# Patient Record
Sex: Male | Born: 2000
Health system: Southern US, Community
[De-identification: ages and names within clinical notes are randomized; demographics above are authoritative.]

## PROBLEM LIST (undated history)

## (undated) HISTORY — PX: OTHER SURGICAL HISTORY: SHX169

---
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
## 2020-02-10 ENCOUNTER — Other Ambulatory Visit: Payer: Self-pay

## 2020-02-10 ENCOUNTER — Ambulatory Visit (HOSPITAL_COMMUNITY): Payer: Self-pay

## 2020-02-10 ENCOUNTER — Ambulatory Visit (HOSPITAL_COMMUNITY)
Admission: EM | Admit: 2020-02-10 | Discharge: 2020-02-10 | Disposition: A | Discharge status: Home / Self Care | Payer: Medicaid Other

## 2020-02-10 ENCOUNTER — Encounter (HOSPITAL_COMMUNITY): Payer: Self-pay | Admitting: Emergency Medicine

## 2020-02-10 DIAGNOSIS — S51812A Laceration without foreign body of left forearm, initial encounter: Secondary | ICD-10-CM | POA: Diagnosis not present

## 2020-02-10 DIAGNOSIS — M79632 Pain in left forearm: Secondary | ICD-10-CM | POA: Diagnosis not present

## 2020-02-10 DIAGNOSIS — Z23 Encounter for immunization: Secondary | ICD-10-CM | POA: Diagnosis not present

## 2020-02-10 MED ORDER — TETANUS-DIPHTH-ACELL PERTUSSIS 5-2.5-18.5 LF-MCG/0.5 IM SUSP
INTRAMUSCULAR | Status: AC
  Filled 2020-02-10: qty 0.5

## 2020-02-10 MED ORDER — TETANUS-DIPHTH-ACELL PERTUSSIS 5-2.5-18.5 LF-MCG/0.5 IM SUSP
0.5000 mL | Freq: Once | INTRAMUSCULAR | Status: AC
  Administered 2020-02-10: 0.5 mL via INTRAMUSCULAR

## 2020-02-10 NOTE — ED Provider Notes (Signed)
  [facility]   MRN: [id number] DOB: Mar 31, 2001  Subjective:   Michael Walters is a 19 y.o. male presenting for suffering a left forearm laceration from a rusty fence.  He has kept the wound covered and clean.  Needs to have his Tdap updated.   Current Facility-Administered Medications:  .  Tdap (BOOSTRIX) injection 0.5 mL, 0.5 mL, Intramuscular, Once, Wallis Bamberg, PA-C  Current Outpatient Medications:  .  cloNIDine (CATAPRES) 0.3 MG tablet, Take by mouth., Disp: , Rfl:  .  estradiol (ESTRACE) 2 MG tablet, Take by mouth., Disp: , Rfl:  .  Methylphenidate HCl (QUILLICHEW ER) 30 MG CHER chewable tablet, Take by mouth., Disp: , Rfl:  .  spironolactone (ALDACTONE) 50 MG tablet, Take by mouth., Disp: , Rfl:    No Known Allergies  History reviewed. No pertinent past medical history.   Past Surgical History:  Procedure Laterality Date  . arm surgery      Family History  Problem Relation Age of Onset  . COPD Father     Social History   Tobacco Use  . Smoking status: Never Smoker  Substance Use Topics  . Alcohol use: Never  . Drug use: Not Currently    Types: Marijuana    ROS   Objective:   Vitals: BP (!) 122/91 (BP Location: Left Arm)   Pulse 91   Temp 98.3 F (36.8 C) (Oral)   Resp 16   SpO2 100%   Physical Exam Constitutional:      General: He is not in acute distress.    Appearance: Normal appearance. He is well-developed and normal weight. He is not ill-appearing, toxic-appearing or diaphoretic.  HENT:     Head: Normocephalic and atraumatic.     Right Ear: External ear normal.     Left Ear: External ear normal.     Nose: Nose normal.     Mouth/Throat:     Pharynx: Oropharynx is clear.  Eyes:     General: No scleral icterus.       Right eye: No discharge.        Left eye: No discharge.     Extraocular Movements: Extraocular movements intact.     Pupils: Pupils are equal, round, and reactive to light.  Cardiovascular:     Rate and  Rhythm: Normal rate.  Pulmonary:     Effort: Pulmonary effort is normal.  Musculoskeletal:     Left forearm: Laceration present. No swelling, edema, deformity, tenderness or bony tenderness.       Arms:     Cervical back: Normal range of motion.  Neurological:     Mental Status: He is alert and oriented to person, place, and time.  Psychiatric:        Mood and Affect: Mood normal.        Behavior: Behavior normal.        Thought Content: Thought content normal.        Judgment: Judgment normal.      Assessment and Plan :   PDMP not reviewed this encounter.  1. Pain of left forearm   2. Laceration of left forearm, initial encounter   3. Need for diphtheria-tetanus-pertussis (Tdap) vaccine     Dressing applied.  Wound care reviewed.  Tdap updated. Counseled patient on potential for adverse effects with medications prescribed/recommended today, ER and return-to-clinic precautions discussed, patient verbalized understanding.    Wallis Bamberg, [place] 02/10/20 1831

## 2020-02-10 NOTE — ED Triage Notes (Signed)
Cut left forearm on a rusty fence on Monday.  Unsure when last tdap was received.

## 2020-02-18 ENCOUNTER — Encounter (HOSPITAL_COMMUNITY): Payer: Self-pay | Admitting: Emergency Medicine

## 2020-02-18 ENCOUNTER — Other Ambulatory Visit: Payer: Self-pay

## 2020-02-18 ENCOUNTER — Emergency Department (HOSPITAL_COMMUNITY)
Admission: EM | Admit: 2020-02-18 | Discharge: 2020-02-19 | Disposition: A | Discharge status: Home / Self Care | Payer: Medicaid Other | Attending: Emergency Medicine | Admitting: Emergency Medicine

## 2020-02-18 ENCOUNTER — Emergency Department (HOSPITAL_COMMUNITY): Payer: Medicaid Other

## 2020-02-18 DIAGNOSIS — Z5321 Procedure and treatment not carried out due to patient leaving prior to being seen by health care provider: Secondary | ICD-10-CM | POA: Insufficient documentation

## 2020-02-18 DIAGNOSIS — R0602 Shortness of breath: Secondary | ICD-10-CM | POA: Diagnosis present

## 2020-02-18 NOTE — ED Triage Notes (Signed)
Pt c/o shortness of breath on exertion x 1 week. Pt reports having a tetanus booster 1 week ago as well.

## 2020-02-19 NOTE — ED Notes (Signed)
Pt called for VS recheck, no response  

## 2020-08-30 ENCOUNTER — Encounter (HOSPITAL_COMMUNITY): Payer: Self-pay | Admitting: Emergency Medicine

## 2020-08-31 ENCOUNTER — Ambulatory Visit (HOSPITAL_COMMUNITY)
Admission: RE | Admit: 2020-08-31 | Discharge: 2020-08-31 | Disposition: A | Discharge status: Home / Self Care | Payer: Medicaid Other | Source: Ambulatory Visit | Attending: Emergency Medicine | Admitting: Emergency Medicine

## 2020-08-31 ENCOUNTER — Other Ambulatory Visit: Payer: Self-pay

## 2020-08-31 ENCOUNTER — Encounter (HOSPITAL_COMMUNITY): Payer: Self-pay

## 2020-08-31 VITALS — BP 123/80 | HR 93 | Temp 97.6°F | Resp 17

## 2020-08-31 DIAGNOSIS — J029 Acute pharyngitis, unspecified: Secondary | ICD-10-CM

## 2020-08-31 LAB — POCT RAPID STREP A, ED / UC: Streptococcus, Group A Screen (Direct): NEGATIVE

## 2020-08-31 NOTE — Discharge Instructions (Signed)
Warm salt water gargles for comfort  Can use otc lozenges and/or chloraseptic spray for comfort  Can use hot or cold liquids for additional comfort  Follow up at Urgent Care for worsening or persistent sore throat

## 2020-08-31 NOTE — ED Provider Notes (Signed)
[facility]    CSN: [id number] Arrival date & time: 08/31/20  1358      History   Chief Complaint Chief Complaint  Patient presents with  . Appointment  . Sore Throat    HPI Michael Walters is a 20 y.o. male.   Patient presents with lump in throat for four day making it painful to swallow. Lump causing pressure in left ear and check. Described as dry and scratchy two days ago.Tolerating food and liquid. Denies fever, chills, congestion, runny nose, cough, shortness of breath, sinus pressure, visual changes, changes in hearing. No known sick contacts.       History reviewed. No pertinent past medical history.  There are no problems to display for this patient.   Past Surgical History:  Procedure Laterality Date  . arm surgery         Home Medications    Prior to Admission medications   Medication Sig Start Date End Date Taking? Authorizing Provider  cloNIDine (CATAPRES) 0.3 MG tablet Take by mouth. 09/16/19 09/15/20 Yes [provider]  estradiol (CLIMARA - DOSED IN MG/24 HR) 0.1 mg/24hr patch Place onto the skin. 08/31/20 08/31/21 Yes [provider]  spironolactone (ALDACTONE) 50 MG tablet Take by mouth. 05/18/20 11/22/20 Yes [provider]  estradiol (ESTRACE) 2 MG tablet Take by mouth. 01/26/20 04/25/20  [provider]  Methylphenidate HCl (QUILLICHEW ER) 30 MG CHER chewable tablet Take by mouth. 03/05/19   [provider]  spironolactone (ALDACTONE) 50 MG tablet Take by mouth. 01/26/20 05/02/20  [provider]    Family History Family History  Problem Relation Age of Onset  . COPD Father     Social History Social History   Tobacco Use  . Smoking status: Never Smoker  . Smokeless tobacco: Never Used  Substance Use Topics  . Alcohol use: Never  . Drug use: Not Currently    Types: Marijuana     Allergies   Patient has no known allergies.   Review of Systems Review of Systems   Constitutional: Negative.   HENT: Positive for ear pain and sore throat. Negative for congestion, dental problem, drooling, ear discharge, facial swelling, hearing loss, mouth sores, nosebleeds, postnasal drip, rhinorrhea, sinus pressure, sinus pain, sneezing, tinnitus, trouble swallowing and voice change.   Eyes: Negative.   Respiratory: Negative.   Cardiovascular: Negative.   Gastrointestinal: Negative.   Skin: Negative.      Physical Exam Triage Vital Signs ED Triage Vitals  Enc Vitals Group     BP 08/31/20 1413 123/80     Pulse Rate 08/31/20 1413 93     Resp 08/31/20 1413 17     Temp 08/31/20 1413 97.6 F (36.4 C)     Temp Source 08/31/20 1413 Oral     SpO2 08/31/20 1413 98 %     Weight --      Height --      Head Circumference --      Peak Flow --      Pain Score 08/31/20 1411 3     Pain Loc --      Pain Edu? --      Excl. in GC? --    No data found.  Updated Vital Signs BP 123/80 (BP Location: Right Arm)   Pulse 93   Temp 97.6 F (36.4 C) (Oral)   Resp 17   SpO2 98%   Visual Acuity Right Eye Distance:   Left Eye Distance:   Bilateral Distance:  Right Eye Near:   Left Eye Near:    Bilateral Near:     Physical Exam Constitutional:      Appearance: He is well-developed and normal weight.  HENT:     Head: Normocephalic.     Right Ear: Tympanic membrane and ear canal normal.     Left Ear: Tympanic membrane and ear canal normal.     Nose: No congestion or rhinorrhea.     Mouth/Throat:     Mouth: Mucous membranes are moist.     Pharynx: Uvula midline. Posterior oropharyngeal erythema present.     Tonsils: No tonsillar exudate or tonsillar abscesses.  Eyes:     Conjunctiva/sclera: Conjunctivae normal.     Pupils: Pupils are equal, round, and reactive to light.  Neck:     Thyroid: No thyromegaly.  Pulmonary:     Effort: Pulmonary effort is normal.  Musculoskeletal:     Cervical back: Normal range of motion and neck supple.  Lymphadenopathy:      Cervical: No cervical adenopathy.  Skin:    General: Skin is warm and dry.  Neurological:     Mental Status: He is alert and oriented to person, place, and time.  Psychiatric:        Mood and Affect: Mood normal.        Behavior: Behavior normal.      UC Treatments / Results  Labs (all labs ordered are listed, but only abnormal results are displayed) Labs Reviewed - No data to display  EKG   Radiology No results found.  Procedures Procedures (including critical care time)  Medications Ordered in UC Medications - No data to display  Initial Impression / Assessment and Plan / UC Course  I have reviewed the triage vital signs and the nursing notes.  Pertinent labs & imaging results that were available during my care of the patient were reviewed by me and considered in my medical decision making (see chart for details).  Viral pharyngitis   1. Rapid strep- negative 2. Warm salt water gargles as needed 3. Use of hot or cold liquid for comfort 4. otc lozenges, otc chloraseptic spray as needed for comfort Final Clinical Impressions(s) / UC Diagnoses   Final diagnoses:  None   Discharge Instructions   None    ED Prescriptions    None     PDMP not reviewed this encounter.   Valinda Hoar, NP 08/31/20 1526

## 2020-08-31 NOTE — ED Triage Notes (Signed)
Pt states he has had a lump in his throat x 4 days. He states it is difficult to swallow and states he feels a sharp pain in his throat. He states it is uncomfortable and states when he swallows he feels pain on left ear and left side of face.

## 2020-09-03 LAB — CULTURE, GROUP A STREP (THRC)

## 2020-12-27 IMAGING — DX DG CHEST 2V
2 series · 2 of 2 positions shown · non-contrast
Comparison: None

CLINICAL DATA: 19-year-old male with shortness of breath.

EXAM:
CHEST - 2 VIEW

[chest pa]
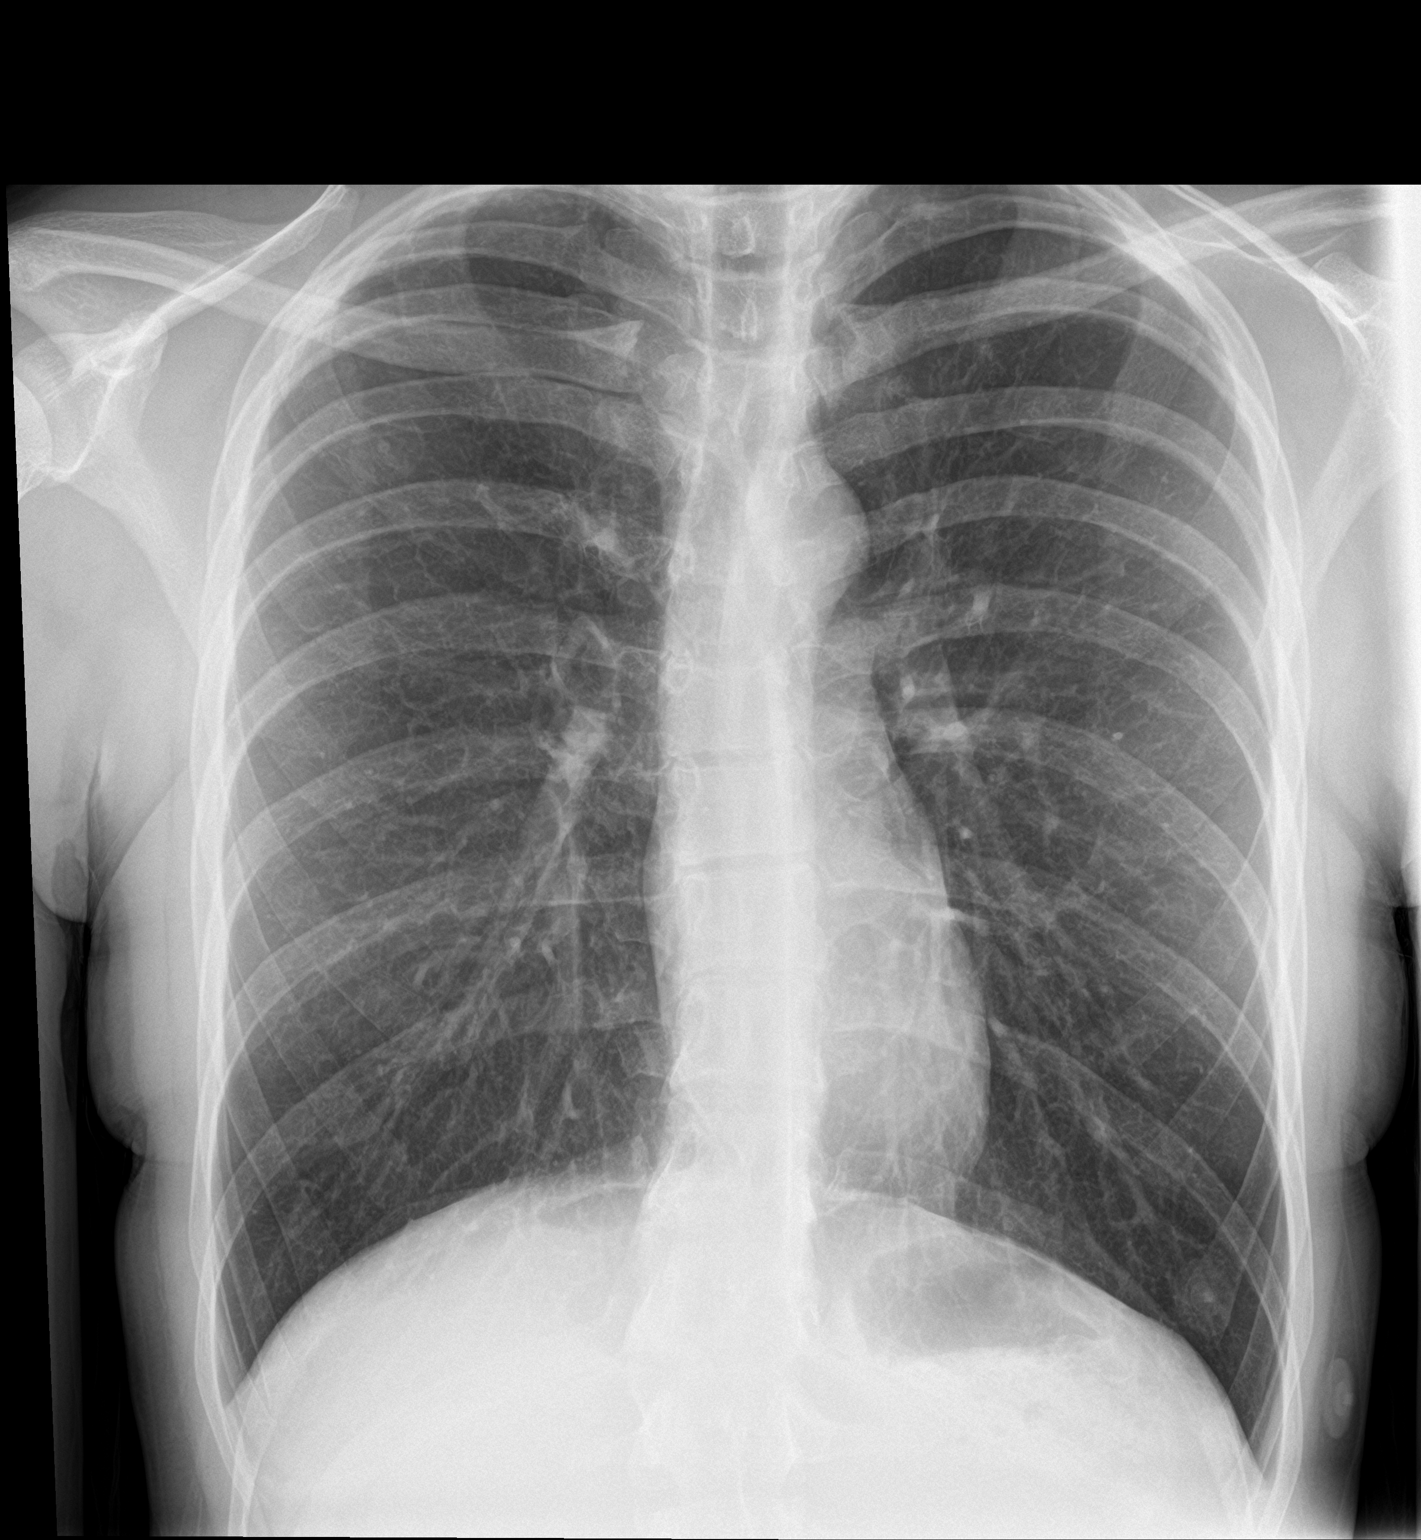

[chest lat]
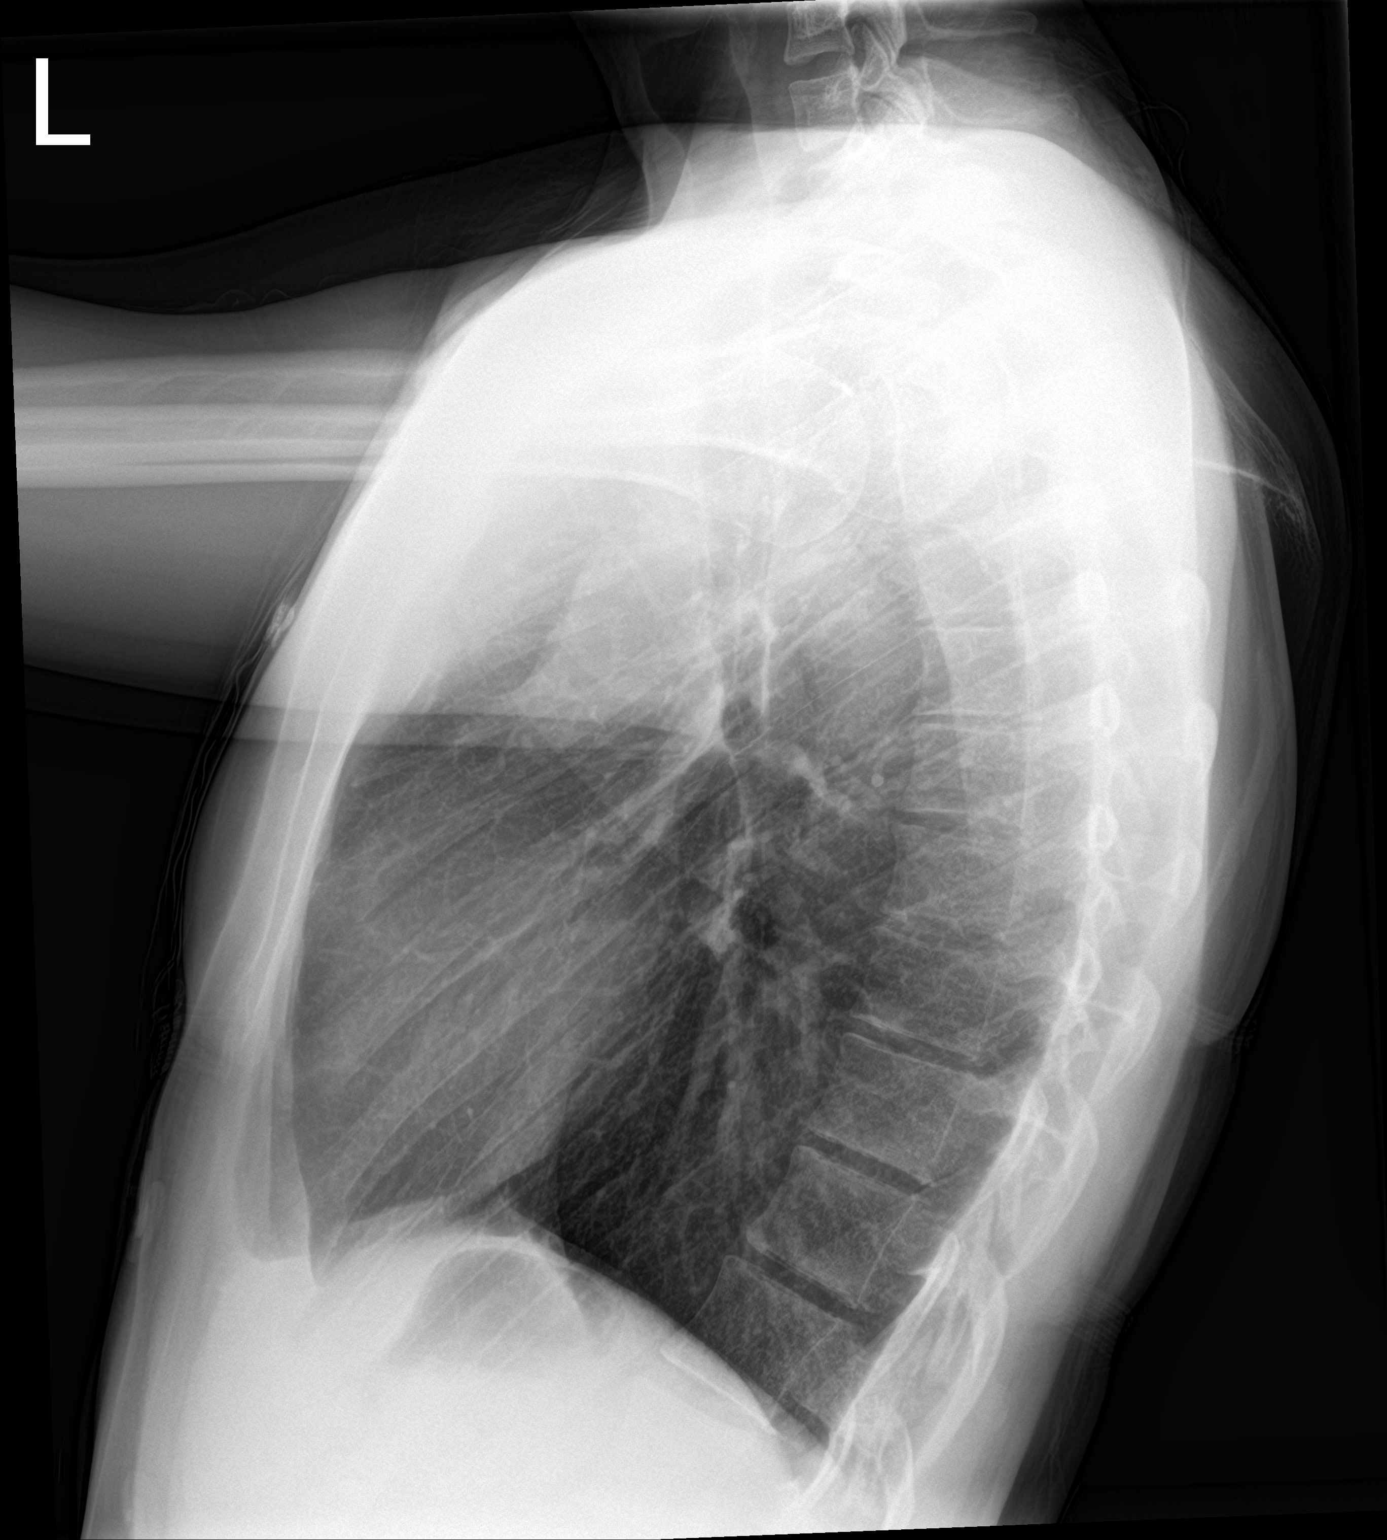

[2 of 2 positions shown; findings below may reference images not displayed]

FINDINGS: The heart size and mediastinal contours are within normal limits.
Both lungs are clear. The visualized skeletal structures are
unremarkable.
IMPRESSION: No active cardiopulmonary disease.
